# Patient Record
Sex: Female | Born: 1950 | Race: White | Hispanic: No | Marital: Married | Smoking: Never smoker
Health system: Southern US, Community
[De-identification: ages and names within clinical notes are randomized; demographics above are authoritative.]

## PROBLEM LIST (undated history)

## (undated) DIAGNOSIS — M1990 Unspecified osteoarthritis, unspecified site: Secondary | ICD-10-CM

## (undated) DIAGNOSIS — I10 Essential (primary) hypertension: Secondary | ICD-10-CM

## (undated) DIAGNOSIS — R51 Headache: Secondary | ICD-10-CM

## (undated) DIAGNOSIS — C801 Malignant (primary) neoplasm, unspecified: Secondary | ICD-10-CM

## (undated) HISTORY — PX: TONSILLECTOMY: SUR1361

## (undated) HISTORY — PX: TUBAL LIGATION: SHX77

---

## 1998-02-03 ENCOUNTER — Other Ambulatory Visit: Admission: RE | Admit: 1998-02-03 | Discharge: 1998-02-03 | Payer: Self-pay | Admitting: Obstetrics and Gynecology

## 1999-03-03 ENCOUNTER — Other Ambulatory Visit: Admission: RE | Admit: 1999-03-03 | Discharge: 1999-03-03 | Payer: Self-pay | Admitting: *Deleted

## 2001-09-17 ENCOUNTER — Other Ambulatory Visit: Admission: RE | Admit: 2001-09-17 | Discharge: 2001-09-17 | Payer: Self-pay | Admitting: Obstetrics and Gynecology

## 2002-10-30 ENCOUNTER — Other Ambulatory Visit: Admission: RE | Admit: 2002-10-30 | Discharge: 2002-10-30 | Payer: Self-pay | Admitting: Obstetrics and Gynecology

## 2003-09-04 ENCOUNTER — Ambulatory Visit (HOSPITAL_COMMUNITY): Admission: RE | Admit: 2003-09-04 | Discharge: 2003-09-04 | Payer: Self-pay | Admitting: Gastroenterology

## 2003-11-27 ENCOUNTER — Other Ambulatory Visit: Admission: RE | Admit: 2003-11-27 | Discharge: 2003-11-27 | Payer: Self-pay | Admitting: Obstetrics and Gynecology

## 2010-10-20 ENCOUNTER — Other Ambulatory Visit: Payer: Self-pay | Admitting: Obstetrics and Gynecology

## 2012-09-16 ENCOUNTER — Encounter (INDEPENDENT_AMBULATORY_CARE_PROVIDER_SITE_OTHER): Payer: Self-pay | Admitting: General Surgery

## 2012-09-16 ENCOUNTER — Ambulatory Visit (INDEPENDENT_AMBULATORY_CARE_PROVIDER_SITE_OTHER): Payer: Federal, State, Local not specified - PPO | Admitting: General Surgery

## 2012-09-16 VITALS — BP 152/90 | HR 76 | Temp 98.2°F | Resp 14 | Ht 66.7 in | Wt 206.0 lb

## 2012-09-16 DIAGNOSIS — C189 Malignant neoplasm of colon, unspecified: Secondary | ICD-10-CM

## 2012-09-16 NOTE — Progress Notes (Signed)
Office visit not done as preoperative H&P  Ashley Velasquez. [name], MD, FACS [phone] [facility]

## 2012-09-16 NOTE — H&P (Signed)
Ashley Velasquez is an 62 y.o. female.   Chief Complaint: Colon cancer on routine screening colonoscopy HPI: Patient has been diligent about colon cancer screening because maternal grandmother died at an early age of colon cancer prior to the patient's birth  Recent colonoscopy demonstrated a polyp, mostly benign, but with high grade dysplasia and small focus of adenocarcinoma (1.2cm).  The area of rresection colonoscopically was not marked with ink.  No past medical history on file.  Past Surgical History  Procedure Laterality Date  . Tubal ligation      Family History  Problem Relation Age of Onset  . Alzheimer's disease Mother    Social History:  reports that she has never smoked. She does not have any smokeless tobacco history on file. She reports that  drinks alcohol. Her drug history is not on file.  Allergies: No Known Allergies   (Not in a hospital admission)  No results found for this or any previous visit (from the past 48 hour(s)). No results found.  Review of Systems  All other systems reviewed and are negative.    Blood pressure 152/90, pulse 76, temperature 98.2 F (36.8 C), resp. rate 14, height 5' 6.7" (1.694 m), weight 206 lb (93.441 kg). Physical Exam  Constitutional: She is oriented to person, place, and time. She appears well-developed and well-nourished.  HENT:  Head: Normocephalic and atraumatic.  Eyes: Conjunctivae and EOM are normal. Pupils are equal, round, and reactive to light.  Neck: Normal range of motion. Neck supple.  Cardiovascular: Normal rate.   Respiratory: Effort normal and breath sounds normal.  GI: Soft. Bowel sounds are normal. She exhibits no mass. There is no tenderness. There is no rebound.  Musculoskeletal: Normal range of motion.  Neurological: She is alert and oriented to person, place, and time. She has normal reflexes.     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  CEA level will be drawn preoperatively. Will schedule patient for right hemicolectomy on Friday.  Preop bowel prep and antibiotics on call.  WYATT, JAMES O 09/16/2012, 2:36 PM    

## 2012-09-17 ENCOUNTER — Encounter (HOSPITAL_COMMUNITY): Payer: Self-pay

## 2012-09-17 ENCOUNTER — Ambulatory Visit
Admission: RE | Admit: 2012-09-17 | Discharge: 2012-09-17 | Disposition: A | Discharge status: Home / Self Care | Payer: Federal, State, Local not specified - PPO | Source: Ambulatory Visit | Attending: General Surgery | Admitting: General Surgery

## 2012-09-17 DIAGNOSIS — C189 Malignant neoplasm of colon, unspecified: Secondary | ICD-10-CM

## 2012-09-17 MED ORDER — IOHEXOL 300 MG/ML  SOLN
125.0000 mL | Freq: Once | INTRAMUSCULAR | Status: AC | PRN
  Administered 2012-09-17: 125 mL via INTRAVENOUS

## 2012-09-19 ENCOUNTER — Telehealth (INDEPENDENT_AMBULATORY_CARE_PROVIDER_SITE_OTHER): Payer: Self-pay | Admitting: *Deleted

## 2012-09-19 ENCOUNTER — Encounter (HOSPITAL_COMMUNITY)
Admission: RE | Admit: 2012-09-19 | Discharge: 2012-09-19 | Disposition: A | Discharge status: Home / Self Care | Payer: Federal, State, Local not specified - PPO | Source: Ambulatory Visit | Attending: Anesthesiology | Admitting: Anesthesiology

## 2012-09-19 ENCOUNTER — Encounter (HOSPITAL_COMMUNITY)
Admission: RE | Admit: 2012-09-19 | Discharge: 2012-09-19 | Disposition: A | Discharge status: Home / Self Care | Payer: Federal, State, Local not specified - PPO | Source: Ambulatory Visit | Attending: General Surgery | Admitting: General Surgery

## 2012-09-19 ENCOUNTER — Encounter (HOSPITAL_COMMUNITY): Payer: Self-pay

## 2012-09-19 HISTORY — DX: Essential (primary) hypertension: I10

## 2012-09-19 HISTORY — DX: Headache: R51

## 2012-09-19 HISTORY — DX: Unspecified osteoarthritis, unspecified site: M19.90

## 2012-09-19 HISTORY — DX: Malignant (primary) neoplasm, unspecified: C80.1

## 2012-09-19 LAB — COMPREHENSIVE METABOLIC PANEL
BUN: 14 mg/dL (ref 6–23)
CO2: 27 mEq/L (ref 19–32)
Chloride: 105 mEq/L (ref 96–112)
Creatinine, Ser: 0.66 mg/dL (ref 0.50–1.10)
GFR calc non Af Amer: >90 mL/min (ref >90)
Total Bilirubin: 0.5 mg/dL (ref 0.3–1.2)

## 2012-09-19 LAB — CBC WITH DIFFERENTIAL/PLATELET
Eosinophils Relative: 3 % (ref 0–5)
HCT: 39.6 % (ref 36.0–46.0)
Lymphocytes Relative: 29 % (ref 12–46)
Lymphs Abs: 1.3 10*3/uL (ref 0.7–4.0)
MCV: 84.8 fL (ref 78.0–100.0)
Monocytes Absolute: 0.5 10*3/uL (ref 0.1–1.0)
RBC: 4.67 MIL/uL (ref 3.87–5.11)
WBC: 4.5 10*3/uL (ref 4.0–10.5)

## 2012-09-19 MED ORDER — ALVIMOPAN 12 MG PO CAPS
12.0000 mg | ORAL_CAPSULE | Freq: Once | ORAL | Status: AC
  Administered 2012-09-20: 12 mg via ORAL
  Filled 2012-09-19: qty 1

## 2012-09-19 MED ORDER — METRONIDAZOLE IN NACL 5-0.79 MG/ML-% IV SOLN
500.0000 mg | INTRAVENOUS | Status: AC
  Administered 2012-09-20: 500 mg via INTRAVENOUS
  Filled 2012-09-19: qty 100

## 2012-09-19 MED ORDER — CEFAZOLIN SODIUM-DEXTROSE 2-3 GM-% IV SOLR
2.0000 g | INTRAVENOUS | Status: AC
  Administered 2012-09-20: 2 g via INTRAVENOUS
  Filled 2012-09-19: qty 50

## 2012-09-19 NOTE — Telephone Encounter (Signed)
[name] from [place] called to state that patient has not yet picked up her prescription for her GoLytely and antibiotic.  Patient plans on picking these up on the way home from the hospital.  Marcelino Duster made aware and will update Wyatt MD for instructions.

## 2012-09-19 NOTE — Telephone Encounter (Signed)
Called patient to advise her to go ahead with the bowel prep and antibiotics.  Patient will begin drinking GoLytely and take antibiotics at 10-21-08.  E

## 2012-09-19 NOTE — Pre-Procedure Instructions (Signed)
Ashley Velasquez  09/19/2012   Your procedure is scheduled on:  OFFICE WILL CALL    Report to [facility] at  AM.  Call this number if you have problems the morning of surgery: [phone]   Remember:   Do not eat food or drink liquids after midnight.   Take these medicines the morning of surgery with A SIP OF WATER:    Do not wear jewelry, make-up or nail polish.  Do not wear lotions, powders, or perfumes. You may wear deodorant.  Do not shave 48 hours prior to surgery.   Do not bring valuables to the hospital.  Contacts, dentures or bridgework may not be worn into surgery.   Leave suitcase in the car. After surgery it may be brought to your room.  For patients admitted to the hospital, checkout time is 11:00 AM the day of  discharge.   Patients discharged the day of surgery will not be allowed to drive  home.  Name and phone number of your driver: [name] --  SPOUSE   Special Instructions: Shower using CHG 2 nights before surgery and the night before surgery.  If you shower the day of surgery use CHG.  Use special wash - you have one bottle of CHG for all showers.  You should use approximately 1/3 of the bottle for each shower.   Please read over the following fact sheets that you were given: Pain Booklet, Coughing and Deep Breathing, MRSA Information and Surgical Site Infection Prevention

## 2012-09-20 ENCOUNTER — Inpatient Hospital Stay (HOSPITAL_COMMUNITY)
Admission: RE | Admit: 2012-09-20 | Discharge: 2012-09-24 | DRG: 149 | Disposition: A | Discharge status: Home / Self Care | Payer: Federal, State, Local not specified - PPO | Source: Ambulatory Visit | Attending: General Surgery | Admitting: General Surgery

## 2012-09-20 ENCOUNTER — Ambulatory Visit (HOSPITAL_COMMUNITY): Payer: Federal, State, Local not specified - PPO | Admitting: Certified Registered"

## 2012-09-20 ENCOUNTER — Encounter (HOSPITAL_COMMUNITY)
Admission: RE | Disposition: A | Discharge status: Home / Self Care | Payer: Self-pay | Source: Ambulatory Visit | Attending: General Surgery

## 2012-09-20 ENCOUNTER — Encounter (HOSPITAL_COMMUNITY): Payer: Self-pay | Admitting: Certified Registered"

## 2012-09-20 ENCOUNTER — Encounter (HOSPITAL_COMMUNITY): Payer: Self-pay | Admitting: *Deleted

## 2012-09-20 DIAGNOSIS — C189 Malignant neoplasm of colon, unspecified: Secondary | ICD-10-CM

## 2012-09-20 DIAGNOSIS — I10 Essential (primary) hypertension: Secondary | ICD-10-CM | POA: Diagnosis present

## 2012-09-20 DIAGNOSIS — D3A022 Benign carcinoid tumor of the ascending colon: Secondary | ICD-10-CM

## 2012-09-20 DIAGNOSIS — Z01812 Encounter for preprocedural laboratory examination: Secondary | ICD-10-CM

## 2012-09-20 DIAGNOSIS — Z01810 Encounter for preprocedural cardiovascular examination: Secondary | ICD-10-CM

## 2012-09-20 DIAGNOSIS — Z800 Family history of malignant neoplasm of digestive organs: Secondary | ICD-10-CM

## 2012-09-20 DIAGNOSIS — Z01818 Encounter for other preprocedural examination: Secondary | ICD-10-CM

## 2012-09-20 DIAGNOSIS — C180 Malignant neoplasm of cecum: Principal | ICD-10-CM | POA: Diagnosis present

## 2012-09-20 DIAGNOSIS — R5082 Postprocedural fever: Secondary | ICD-10-CM | POA: Diagnosis not present

## 2012-09-20 DIAGNOSIS — M129 Arthropathy, unspecified: Secondary | ICD-10-CM | POA: Diagnosis present

## 2012-09-20 HISTORY — PX: PARTIAL COLECTOMY: SHX5273

## 2012-09-20 SURGERY — COLECTOMY, PARTIAL
Anesthesia: General | Site: Abdomen | Laterality: Right | Wound class: Clean Contaminated

## 2012-09-20 MED ORDER — DIPHENHYDRAMINE HCL 50 MG/ML IJ SOLN: 12.5000 mg | Freq: Four times a day (QID) | INTRAMUSCULAR | Status: DC | PRN

## 2012-09-20 MED ORDER — BUPIVACAINE-EPINEPHRINE 0.25% -1:200000 IJ SOLN
INTRAMUSCULAR | Status: DC | PRN
  Administered 2012-09-20: 20 mL

## 2012-09-20 MED ORDER — ALVIMOPAN 12 MG PO CAPS
12.0000 mg | ORAL_CAPSULE | Freq: Two times a day (BID) | ORAL | Status: DC
  Administered 2012-09-21 – 2012-09-22 (×4): 12 mg via ORAL
  Filled 2012-09-20 (×6): qty 1

## 2012-09-20 MED ORDER — HYDROMORPHONE 0.3 MG/ML IV SOLN
INTRAVENOUS | Status: DC
  Administered 2012-09-20: 14:00:00 via INTRAVENOUS
  Administered 2012-09-20 (×2): 1.59 mg via INTRAVENOUS
  Administered 2012-09-21: 1.39 mg via INTRAVENOUS
  Administered 2012-09-21: 1.59 mg via INTRAVENOUS
  Administered 2012-09-21 (×2): via INTRAVENOUS
  Administered 2012-09-21: 2.11 mg via INTRAVENOUS
  Administered 2012-09-21: 3.39 mg via INTRAVENOUS
  Administered 2012-09-21: 2.39 mg via INTRAVENOUS
  Administered 2012-09-21: 0.59 mg via INTRAVENOUS
  Administered 2012-09-22: 0.44 mg via INTRAVENOUS
  Administered 2012-09-22: 0.2 mg via INTRAVENOUS
  Administered 2012-09-22: 0.6 mg via INTRAVENOUS
  Administered 2012-09-22: 1.39 mg via INTRAVENOUS
  Administered 2012-09-22: 0.599 mg via INTRAVENOUS
  Administered 2012-09-22: 0.59 mg via INTRAVENOUS
  Administered 2012-09-23: 0.799 mg via INTRAVENOUS
  Administered 2012-09-23: 0.4 mg via INTRAVENOUS
  Administered 2012-09-23: 0.799 mg via INTRAVENOUS
  Administered 2012-09-23: 09:00:00 via INTRAVENOUS
  Filled 2012-09-20 (×3): qty 25

## 2012-09-20 MED ORDER — HYDROMORPHONE 0.3 MG/ML IV SOLN
INTRAVENOUS | Status: AC
  Filled 2012-09-20: qty 25

## 2012-09-20 MED ORDER — ERYTHROMYCIN BASE 250 MG PO TABS: 1000.0000 mg | ORAL_TABLET | ORAL | Status: DC

## 2012-09-20 MED ORDER — ZOLPIDEM TARTRATE 5 MG PO TABS: 5.0000 mg | ORAL_TABLET | Freq: Every evening | ORAL | Status: DC | PRN

## 2012-09-20 MED ORDER — HYDROMORPHONE HCL PF 1 MG/ML IJ SOLN
INTRAMUSCULAR | Status: DC | PRN
  Administered 2012-09-20: 1 mg via INTRAVENOUS

## 2012-09-20 MED ORDER — LIDOCAINE HCL (CARDIAC) 20 MG/ML IV SOLN
INTRAVENOUS | Status: DC | PRN
  Administered 2012-09-20: 60 mg via INTRAVENOUS

## 2012-09-20 MED ORDER — KCL IN DEXTROSE-NACL 20-5-0.45 MEQ/L-%-% IV SOLN
INTRAVENOUS | Status: AC
  Filled 2012-09-20: qty 1000

## 2012-09-20 MED ORDER — ENOXAPARIN SODIUM 40 MG/0.4ML ~~LOC~~ SOLN
40.0000 mg | SUBCUTANEOUS | Status: DC
  Administered 2012-09-21 – 2012-09-23 (×3): 40 mg via SUBCUTANEOUS
  Filled 2012-09-20 (×5): qty 0.4

## 2012-09-20 MED ORDER — ROCURONIUM BROMIDE 100 MG/10ML IV SOLN
INTRAVENOUS | Status: DC | PRN
  Administered 2012-09-20: 50 mg via INTRAVENOUS
  Administered 2012-09-20: 10 mg via INTRAVENOUS

## 2012-09-20 MED ORDER — PEG 3350-KCL-NA BICARB-NACL 420 G PO SOLR: 4000.0000 mL | Freq: Once | ORAL | Status: DC

## 2012-09-20 MED ORDER — POVIDONE-IODINE 10 % EX OINT
TOPICAL_OINTMENT | CUTANEOUS | Status: AC
  Filled 2012-09-20: qty 28.35

## 2012-09-20 MED ORDER — MIDAZOLAM HCL 5 MG/5ML IJ SOLN
INTRAMUSCULAR | Status: DC | PRN
  Administered 2012-09-20: 2 mg via INTRAVENOUS

## 2012-09-20 MED ORDER — HYDROMORPHONE HCL PF 1 MG/ML IJ SOLN
0.2500 mg | INTRAMUSCULAR | Status: DC | PRN
  Administered 2012-09-20 (×4): 0.5 mg via INTRAVENOUS

## 2012-09-20 MED ORDER — LISINOPRIL 5 MG PO TABS
5.0000 mg | ORAL_TABLET | Freq: Every day | ORAL | Status: DC
  Administered 2012-09-20 – 2012-09-23 (×4): 5 mg via ORAL
  Filled 2012-09-20 (×5): qty 1

## 2012-09-20 MED ORDER — NALOXONE HCL 0.4 MG/ML IJ SOLN: 0.4000 mg | INTRAMUSCULAR | Status: DC | PRN

## 2012-09-20 MED ORDER — KCL IN DEXTROSE-NACL 20-5-0.45 MEQ/L-%-% IV SOLN
INTRAVENOUS | Status: DC
  Administered 2012-09-20 – 2012-09-23 (×8): via INTRAVENOUS
  Filled 2012-09-20 (×13): qty 1000

## 2012-09-20 MED ORDER — ALUM & MAG HYDROXIDE-SIMETH 200-200-20 MG/5ML PO SUSP: 30.0000 mL | Freq: Four times a day (QID) | ORAL | Status: DC | PRN

## 2012-09-20 MED ORDER — HYDROMORPHONE HCL PF 1 MG/ML IJ SOLN
INTRAMUSCULAR | Status: AC
  Filled 2012-09-20: qty 1

## 2012-09-20 MED ORDER — LACTATED RINGERS IV SOLN
INTRAVENOUS | Status: DC
  Administered 2012-09-20: 11:00:00 via INTRAVENOUS

## 2012-09-20 MED ORDER — DIPHENHYDRAMINE HCL 12.5 MG/5ML PO ELIX
12.5000 mg | ORAL_SOLUTION | Freq: Four times a day (QID) | ORAL | Status: DC | PRN
  Filled 2012-09-20: qty 5

## 2012-09-20 MED ORDER — METRONIDAZOLE IN NACL 5-0.79 MG/ML-% IV SOLN
500.0000 mg | Freq: Three times a day (TID) | INTRAVENOUS | Status: AC
Start: 2012-09-20 — End: 2012-09-20
  Administered 2012-09-20: 500 mg via INTRAVENOUS
  Filled 2012-09-20: qty 100

## 2012-09-20 MED ORDER — ONDANSETRON HCL 4 MG/2ML IJ SOLN: 4.0000 mg | Freq: Four times a day (QID) | INTRAMUSCULAR | Status: DC | PRN

## 2012-09-20 MED ORDER — LACTATED RINGERS IV SOLN
INTRAVENOUS | Status: DC | PRN
  Administered 2012-09-20 (×3): via INTRAVENOUS

## 2012-09-20 MED ORDER — ONDANSETRON HCL 4 MG/2ML IJ SOLN: 4.0000 mg | Freq: Once | INTRAMUSCULAR | Status: DC | PRN

## 2012-09-20 MED ORDER — SODIUM CHLORIDE 0.9 % IR SOLN
Status: DC | PRN
  Administered 2012-09-20 (×3): 1000 mL

## 2012-09-20 MED ORDER — FENTANYL CITRATE 0.05 MG/ML IJ SOLN
INTRAMUSCULAR | Status: DC | PRN
  Administered 2012-09-20: 50 ug via INTRAVENOUS
  Administered 2012-09-20 (×2): 100 ug via INTRAVENOUS
  Administered 2012-09-20 (×3): 50 ug via INTRAVENOUS

## 2012-09-20 MED ORDER — GLYCOPYRROLATE 0.2 MG/ML IJ SOLN
INTRAMUSCULAR | Status: DC | PRN
  Administered 2012-09-20: .6 mg via INTRAVENOUS

## 2012-09-20 MED ORDER — CEFAZOLIN SODIUM-DEXTROSE 2-3 GM-% IV SOLR
2.0000 g | Freq: Three times a day (TID) | INTRAVENOUS | Status: AC
  Administered 2012-09-20: 2 g via INTRAVENOUS
  Filled 2012-09-20: qty 50

## 2012-09-20 MED ORDER — NEOSTIGMINE METHYLSULFATE 1 MG/ML IJ SOLN
INTRAMUSCULAR | Status: DC | PRN
  Administered 2012-09-20: 4 mg via INTRAVENOUS

## 2012-09-20 MED ORDER — PROPOFOL 10 MG/ML IV BOLUS
INTRAVENOUS | Status: DC | PRN
  Administered 2012-09-20: 200 mg via INTRAVENOUS

## 2012-09-20 MED ORDER — ONDANSETRON HCL 4 MG/2ML IJ SOLN
INTRAMUSCULAR | Status: DC | PRN
  Administered 2012-09-20: 4 mg via INTRAVENOUS

## 2012-09-20 MED ORDER — BUPIVACAINE-EPINEPHRINE 0.25% -1:200000 IJ SOLN
INTRAMUSCULAR | Status: AC
  Filled 2012-09-20: qty 1

## 2012-09-20 MED ORDER — ATORVASTATIN CALCIUM 40 MG PO TABS
40.0000 mg | ORAL_TABLET | Freq: Every day | ORAL | Status: DC
  Administered 2012-09-20 – 2012-09-23 (×4): 40 mg via ORAL
  Filled 2012-09-20 (×5): qty 1

## 2012-09-20 MED ORDER — SODIUM CHLORIDE 0.9 % IJ SOLN: 9.0000 mL | INTRAMUSCULAR | Status: DC | PRN

## 2012-09-20 MED ORDER — NEOMYCIN SULFATE 500 MG PO TABS: 1000.0000 mg | ORAL_TABLET | ORAL | Status: DC

## 2012-09-20 SURGICAL SUPPLY — 66 items
ADH SKN CLS APL DERMABOND .7 (GAUZE/BANDAGES/DRESSINGS) ×1
BLADE SURG ROTATE 9660 (MISCELLANEOUS) IMPLANT
CANISTER SUCTION 2500CC (MISCELLANEOUS) ×2 IMPLANT
CHLORAPREP W/TINT 26ML (MISCELLANEOUS) ×2 IMPLANT
CLOTH BEACON ORANGE TIMEOUT ST (SAFETY) ×2 IMPLANT
CLSR STERI-STRIP ANTIMIC 1/2X4 (GAUZE/BANDAGES/DRESSINGS) ×1 IMPLANT
COVER MAYO STAND STRL (DRAPES) ×2 IMPLANT
COVER SURGICAL LIGHT HANDLE (MISCELLANEOUS) ×2 IMPLANT
DECANTER SPIKE VIAL GLASS SM (MISCELLANEOUS) ×1 IMPLANT
DERMABOND ADVANCED (GAUZE/BANDAGES/DRESSINGS) ×1
DERMABOND ADVANCED .7 DNX12 (GAUZE/BANDAGES/DRESSINGS) IMPLANT
DRAPE LAPAROSCOPIC ABDOMINAL (DRAPES) ×2 IMPLANT
DRAPE PROXIMA HALF (DRAPES) IMPLANT
DRAPE UTILITY 15X26 W/TAPE STR (DRAPE) ×6 IMPLANT
DRAPE WARM FLUID 44X44 (DRAPE) ×2 IMPLANT
DRSG TEGADERM 4X4.75 (GAUZE/BANDAGES/DRESSINGS) ×1 IMPLANT
ELECT BLADE 6.5 EXT (BLADE) ×1 IMPLANT
ELECT CAUTERY BLADE 6.4 (BLADE) ×4 IMPLANT
ELECT REM PT RETURN 9FT ADLT (ELECTROSURGICAL) ×2
ELECTRODE REM PT RTRN 9FT ADLT (ELECTROSURGICAL) ×1 IMPLANT
GLOVE BIO SURGEON STRL SZ 6.5 (GLOVE) ×3 IMPLANT
GLOVE BIOGEL PI IND STRL 7.0 (GLOVE) IMPLANT
GLOVE BIOGEL PI IND STRL 8 (GLOVE) ×2 IMPLANT
GLOVE BIOGEL PI INDICATOR 7.0 (GLOVE) ×3
GLOVE BIOGEL PI INDICATOR 8 (GLOVE) ×3
GLOVE ECLIPSE 7.5 STRL STRAW (GLOVE) ×5 IMPLANT
GLOVE EUDERMIC 6.5 POWDERFREE (GLOVE) ×1 IMPLANT
GLOVE SURG SS PI 7.0 STRL IVOR (GLOVE) ×1 IMPLANT
GOWN STRL NON-REIN LRG LVL3 (GOWN DISPOSABLE) ×13 IMPLANT
GOWN STRL REIN 2XL XLG LVL4 (GOWN DISPOSABLE) ×1 IMPLANT
GOWN STRL REIN 3XL XLG LVL4 (GOWN DISPOSABLE) ×1 IMPLANT
KIT BASIN OR (CUSTOM PROCEDURE TRAY) ×2 IMPLANT
KIT ROOM TURNOVER OR (KITS) ×2 IMPLANT
LEGGING LITHOTOMY PAIR STRL (DRAPES) IMPLANT
LIGASURE IMPACT 36 18CM CVD LR (INSTRUMENTS) ×1 IMPLANT
NDL HYPO 25GX1X1/2 BEV (NEEDLE) IMPLANT
NEEDLE HYPO 25GX1X1/2 BEV (NEEDLE) ×2 IMPLANT
NS IRRIG 1000ML POUR BTL (IV SOLUTION) ×5 IMPLANT
PACK GENERAL/GYN (CUSTOM PROCEDURE TRAY) ×2 IMPLANT
PAD ARMBOARD 7.5X6 YLW CONV (MISCELLANEOUS) ×2 IMPLANT
PAD SHARPS MAGNETIC DISPOSAL (MISCELLANEOUS) ×2 IMPLANT
RELOAD PROXIMATE 75MM BLUE (ENDOMECHANICALS) ×4 IMPLANT
RELOAD STAPLE 75 3.8 BLU REG (ENDOMECHANICALS) IMPLANT
SPECIMEN JAR X LARGE (MISCELLANEOUS) ×2 IMPLANT
SPONGE LAP 18X18 X RAY DECT (DISPOSABLE) ×2 IMPLANT
STAPLER GUN LINEAR PROX 60 (STAPLE) ×1 IMPLANT
STAPLER PROXIMATE 75MM BLUE (STAPLE) ×1 IMPLANT
STAPLER VISISTAT 35W (STAPLE) ×2 IMPLANT
SUCTION POOLE TIP (SUCTIONS) ×2 IMPLANT
SURGILUBE 2OZ TUBE FLIPTOP (MISCELLANEOUS) IMPLANT
SUT MNCRL AB 3-0 PS2 18 (SUTURE) ×1 IMPLANT
SUT PDS AB 1 TP1 96 (SUTURE) ×6 IMPLANT
SUT PROLENE 2 0 CT2 30 (SUTURE) IMPLANT
SUT PROLENE 2 0 KS (SUTURE) IMPLANT
SUT SILK 2 0 SH CR/8 (SUTURE) ×3 IMPLANT
SUT SILK 2 0 TIES 10X30 (SUTURE) ×2 IMPLANT
SUT SILK 3 0 SH CR/8 (SUTURE) ×2 IMPLANT
SUT SILK 3 0 TIES 10X30 (SUTURE) ×2 IMPLANT
SYR BULB IRRIGATION 50ML (SYRINGE) ×2 IMPLANT
SYR CONTROL 10ML LL (SYRINGE) ×1 IMPLANT
TOWEL OR 17X26 10 PK STRL BLUE (TOWEL DISPOSABLE) ×4 IMPLANT
TRAY FOLEY CATH 14FRSI W/METER (CATHETERS) ×1 IMPLANT
TRAY PROCTOSCOPIC FIBER OPTIC (SET/KITS/TRAYS/PACK) IMPLANT
UNDERPAD 30X30 INCONTINENT (UNDERPADS AND DIAPERS) IMPLANT
WATER STERILE IRR 1000ML POUR (IV SOLUTION) IMPLANT
YANKAUER SUCT BULB TIP NO VENT (SUCTIONS) ×2 IMPLANT

## 2012-09-20 NOTE — Progress Notes (Signed)
Patient transferred from PACU to [phone].  Alert and oriented, family at bedside.  Oriented to room and call light.  Will continue to monitor.

## 2012-09-20 NOTE — Anesthesia Preprocedure Evaluation (Signed)
Anesthesia Evaluation  Patient identified by MRN, date of birth, ID band Patient awake    Reviewed: Allergy & Precautions, H&P , NPO status , Patient's Chart, lab work & pertinent test results  Airway Mallampati: I TM Distance: >3 FB Neck ROM: full    Dental   Pulmonary          Cardiovascular hypertension, Rhythm:regular Rate:Normal     Neuro/Psych  Headaches,    GI/Hepatic   Endo/Other    Renal/GU      Musculoskeletal  (+) Arthritis -,   Abdominal   Peds  Hematology   Anesthesia Other Findings   Reproductive/Obstetrics                           Anesthesia Physical Anesthesia Plan  ASA: II  Anesthesia Plan: General   Post-op Pain Management:    Induction: Intravenous  Airway Management Planned: Oral ETT  Additional Equipment:   Intra-op Plan:   Post-operative Plan: Extubation in OR  Informed Consent: I have reviewed the patients History and Physical, chart, labs and discussed the procedure including the risks, benefits and alternatives for the proposed anesthesia with the patient or authorized representative who has indicated his/her understanding and acceptance.     Plan Discussed with: CRNA, Anesthesiologist and Surgeon  Anesthesia Plan Comments:         Anesthesia Quick Evaluation

## 2012-09-20 NOTE — Progress Notes (Signed)
UR completed 

## 2012-09-20 NOTE — H&P (View-Only) (Signed)
Ashley Velasquez is an 62 y.o. female.   Chief Complaint: Colon cancer on routine screening colonoscopy HPI: Patient has been diligent about colon cancer screening because maternal grandmother died at an early age of colon cancer prior to the patient's birth  Recent colonoscopy demonstrated a polyp, mostly benign, but with high grade dysplasia and small focus of adenocarcinoma (1.2cm).  The area of rresection colonoscopically was not marked with ink.  No past medical history on file.  Past Surgical History  Procedure Laterality Date  . Tubal ligation      Family History  Problem Relation Age of Onset  . Alzheimer's disease Mother    Social History:  reports that she has never smoked. She does not have any smokeless tobacco history on file. She reports that  drinks alcohol. Her drug history is not on file.  Allergies: No Known Allergies   (Not in a hospital admission)  No results found for this or any previous visit (from the past 48 hour(s)). No results found.  Review of Systems  All other systems reviewed and are negative.    Blood pressure 152/90, pulse 76, temperature 98.2 F (36.8 C), resp. rate 14, height 5' 6.7" (1.694 m), weight 206 lb (93.441 kg). Physical Exam  Constitutional: She is oriented to person, place, and time. She appears well-developed and well-nourished.  HENT:  Head: Normocephalic and atraumatic.  Eyes: Conjunctivae and EOM are normal. Pupils are equal, round, and reactive to light.  Neck: Normal range of motion. Neck supple.  Cardiovascular: Normal rate.   Respiratory: Effort normal and breath sounds normal.  GI: Soft. Bowel sounds are normal. She exhibits no mass. There is no tenderness. There is no rebound.  Musculoskeletal: Normal range of motion.  Neurological: She is alert and oriented to person, place, and time. She has normal reflexes.     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  CEA level will be drawn preoperatively. Will schedule patient for right hemicolectomy on Friday.  Preop bowel prep and antibiotics on call.  Cherylynn Ridges 09/16/2012, 2:36 PM

## 2012-09-20 NOTE — Anesthesia Procedure Notes (Signed)
Procedure Name: Intubation Date/Time: 09/20/2012 11:37 AM Performed by: Armandina Gemma Pre-anesthesia Checklist: Patient identified, Emergency Drugs available, Suction available, Patient being monitored and Timeout performed Patient Re-evaluated:Patient Re-evaluated prior to inductionOxygen Delivery Method: Circle system utilized Preoxygenation: Pre-oxygenation with 100% oxygen Intubation Type: IV induction Ventilation: Mask ventilation without difficulty Laryngoscope Size: Miller and 2 Grade View: Grade I Tube type: Oral Tube size: 7.0 mm Number of attempts: 1 Airway Equipment and Method: Stylet Placement Confirmation: ETT inserted through vocal cords under direct vision,  breath sounds checked- equal and bilateral and positive ETCO2 (atraumatic teeth and mouth as preop- bilat BS Smith) Secured at: 21 (21) cm Tube secured with: Tape Dental Injury: Teeth and Oropharynx as per pre-operative assessment

## 2012-09-20 NOTE — Op Note (Signed)
OPERATIVE REPORT  DATE OF OPERATION: 09/20/2012  PATIENT:  Ashley Velasquez  62 y.o. female  PRE-OPERATIVE DIAGNOSIS:  COLON CANCER in polyp  POST-OPERATIVE DIAGNOSIS:  Residual colonoscopic excision site in ascending colon  PROCEDURE:  Procedure(s): RIGHT HEMI COLECTOMY  SURGEON:  Surgeon(s): Cherylynn Ridges, MD Romie Levee, MD  ASSISTANT: Maisie Fus, A.  ANESTHESIA:   general  EBL: 100 ml  BLOOD ADMINISTERED: none  DRAINS: Urinary Catheter (Foley)   SPECIMEN:  Source of Specimen:  terminal ileum and right colon  COUNTS CORRECT:  YES  PROCEDURE DETAILS: The patient was taken to the operating room and placed on the table in the supine position. After an adequate general endotracheal anesthetic was administered she was prepped and draped in the usual sterile manner exposing her entire abdomen.  After proper time out was performed identifying the patient and the procedure to be performed a right transverse abdominal incision was made at the level of the umbilicus. It was taken down to and through the anterior rectus sheath. We then used cautery to go through the rectus muscle down to the posterior rectus sheath. We then incised the posterior rectus sheath while tenting up on it with forceps. We did so with electrocautery. We opened the incision medially and laterally. We then use Richardson retractor in place as we mobilized the colon.  The right colon was mobilized up to and past the mid transverse colon on the right side. We took down the hepatic flexure. We mobilized the terminal ileum and the cecum. All this we were able to bring up into the wound and resected. A GIA 675 stapler was passed across the terminal ileum and also the right transverse colon. The mesentery in between was taken using a LigaSure device. One large right colonic vessel was taken with a 2-0 silk tie.  Once the specimen was removed from the field the surgeon opened it on table away from the operative field. At  the site of the previous colonoscopic removal was a healing submucosal scar where the polyp had been removed. The distal margin was marked with a suture.  The surgeon then changed gown and glove. An anastomosis was made between the terminal ileum and the mid transverse colon using a GIA-75 stapler. The resulting enterotomy was subsequently closed using aTX-60 stapler. Once this was done the mesentery was closed using interrupted 2-0 silk sutures. Care was taken to make sure the small bowel and the large bowel mesentery was not twisted prior to the anastomosis.  Once the anastomosis was completed the surgeon and the assistant both changed gowns and gloves again. We irrigated with saline solution. Once this was done the fascia was closed posterior rectus sheath initially using running looped #1 PDS suture. The anterior rectus sheath was closed with the same suture. We irrigated the subcutaneous with saline solution we injected 20 cc of quarter percent Marcaine with epinephrine into the subcutaneous tissue then closed the skin using a running subcuticular stitch of 3-0 Monocryl. Dermabond and Steri-Strips and Tegaderms were used to complete the dressing.  All needle counts, sponge counts, and instrument counts were correct.  PATIENT DISPOSITION:  PACU - hemodynamically stable.   Cherylynn Ridges 3/7/20141:23 PM

## 2012-09-20 NOTE — Preoperative (Signed)
Beta Blockers   Reason not to administer Beta Blockers:Not Applicable 

## 2012-09-20 NOTE — Anesthesia Postprocedure Evaluation (Signed)
  Anesthesia Post-op Note  Patient: Ashley Velasquez  Procedure(s) Performed: Procedure(s): RIGHT HEMI COLECTOMY (Right)  Patient Location: PACU  Anesthesia Type:General  Level of Consciousness: alert   Airway and Oxygen Therapy: Patient Spontanous Breathing  Post-op Pain: mild  Post-op Assessment: Post-op Vital signs reviewed  Post-op Vital Signs: stable  Complications: No apparent anesthesia complications

## 2012-09-20 NOTE — Interval H&P Note (Signed)
History and Physical Interval Note:  Patient's CT scan does not suggest metastatic disease.  Will resect through right transverse incision.  Patient tolerated prep.  09/20/2012 11:06 AM  Ashley Velasquez  has presented today for surgery, with the diagnosis of COLON CANCER  The various methods of treatment have been discussed with the patient and family. After consideration of risks, benefits and other options for treatment, the patient has consented to  Procedure(s): RIGHT PARTIAL COLECTOMY (Right) as a surgical intervention .  The patient's history has been reviewed, patient examined, no change in status, stable for surgery.  I have reviewed the patient's chart and labs.  Questions were answered to the patient's satisfaction.     WYATT, Marta Lamas

## 2012-09-20 NOTE — Transfer of Care (Signed)
Immediate Anesthesia Transfer of Care Note  Patient: Ashley Velasquez  Procedure(s) Performed: Procedure(s): RIGHT HEMI COLECTOMY (Right)  Patient Location: PACU  Anesthesia Type:General  Level of Consciousness: sedated  Airway & Oxygen Therapy: Patient Spontanous Breathing and Patient connected to nasal cannula oxygen  Post-op Assessment: Report given to PACU RN and Post -op Vital signs reviewed and stable  Post vital signs: Reviewed and stable  Complications: No apparent anesthesia complications

## 2012-09-21 LAB — BASIC METABOLIC PANEL
BUN: 8 mg/dL (ref 6–23)
CO2: 24 mEq/L (ref 19–32)
Chloride: 103 mEq/L (ref 96–112)
Creatinine, Ser: 0.66 mg/dL (ref 0.50–1.10)
GFR calc non Af Amer: >90 mL/min (ref >90)
Sodium: 136 mEq/L (ref 135–145)

## 2012-09-21 LAB — CBC
HCT: 33.9 % — ABNORMAL LOW (ref 36.0–46.0)
Hemoglobin: 11.7 g/dL — ABNORMAL LOW (ref 12.0–15.0)
MCH: 29.5 pg (ref 26.0–34.0)
MCHC: 34.5 g/dL (ref 30.0–36.0)
RDW: 14.1 % (ref 11.5–15.5)

## 2012-09-21 MED ORDER — ACETAMINOPHEN 10 MG/ML IV SOLN
1000.0000 mg | Freq: Four times a day (QID) | INTRAVENOUS | Status: AC
  Administered 2012-09-21 – 2012-09-22 (×4): 1000 mg via INTRAVENOUS
  Filled 2012-09-21 (×5): qty 100

## 2012-09-21 NOTE — Progress Notes (Signed)
Patient interviewed and examined, agree with PA note above.  Mariella Saa MD, FACS  09/21/2012 12:59 PM

## 2012-09-21 NOTE — Progress Notes (Signed)
1 Day Post-Op  Subjective: Sore, no gas, doesn't want to sit up because it hurts, on liquids, I told her to go slow.  No flatus so far.    Objective: Vital signs in last 24 hours: Temp:  [97.1 F (36.2 C)-98.3 F (36.8 C)] 98.3 F (36.8 C) (03/08 0606) Pulse Rate:  [55-77] 70 (03/08 0606) Resp:  [11-21] 14 (03/08 0759) BP: (100-135)/(54-81) 106/54 mmHg (03/08 0606) SpO2:  [92 %-99 %] 97 % (03/08 0759) Weight:  [203 lb 14.8 oz (92.5 kg)] 203 lb 14.8 oz (92.5 kg) (03/07 1600) Last BM Date: 09/19/12 Diet: Full liquids, afebrile, VSS, labs OK,  Intake/Output from previous day: 03/07 0701 - 03/08 0700 In: 3840 [P.O.:120; I.V.:3570; IV Piggyback:150] Out: 1455 [Urine:1355; Blood:100] Intake/Output this shift:    General appearance: alert, cooperative and no distress Resp: clear to auscultation bilaterally GI: soft, very tender and sore, BS are present, slow, incision looks good, no flatus.  Lab Results:   Recent Labs  09/19/12 1153 09/21/12 0516  WBC 4.5 6.6  HGB 13.7 11.7*  HCT 39.6 33.9*  PLT 194 176    BMET  Recent Labs  09/19/12 1153 09/21/12 0516  NA 141 136  K 4.1 4.2  CL 105 103  CO2 27 24  GLUCOSE 93 112*  BUN 14 8  CREATININE 0.66 0.66  CALCIUM 10.0 8.6   PT/INR No results found for this basename: LABPROT, INR,  in the last 72 hours   Recent Labs Lab 09/19/12 1153  AST 34  ALT 49*  ALKPHOS 75  BILITOT 0.5  PROT 7.8  ALBUMIN 4.1     Lipase  No results found for this basename: lipase     Studies/Results: Dg Chest 2 View  09/19/2012  *RADIOLOGY REPORT*  Clinical Data: Hypertension, migraines.  Right partial colectomy.  CHEST - 2 VIEW  Comparison: CT abdomen pelvis 09/17/2012.  Findings: Trachea is midline.  Heart size normal.  A loculated area of lucency at the right lung base corresponds to a bullous lesion on the comparison examination of 09/17/2012.  Lungs are clear.  No pleural fluid.  IMPRESSION: No acute findings.   Original Report  Authenticated By: Leanna Battles, M.D.     Medications: . alvimopan  12 mg Oral BID  . atorvastatin  40 mg Oral QHS  . enoxaparin (LOVENOX) injection  40 mg Subcutaneous Q24H  . HYDROmorphone PCA 0.3 mg/mL   Intravenous Q4H  . lisinopril  5 mg Oral QHS    Assessment/Plan Colon cancer, s/p right partial colectomy 09/20/12 Dr. Lindie Spruce S/p tubal ligation   PLan:  Mobilize, and IS, continue PCA, add IV tylenol.    LOS: 1 day    Ashley Velasquez,Ashley Velasquez 09/21/2012

## 2012-09-22 NOTE — Progress Notes (Signed)
2 Days Post-Op  Subjective: No flatus but not as sore as before.  Got up some yesterday, and going to bathroom allot to void.  Objective: Vital signs in last 24 hours: Temp:  [98.3 F (36.8 C)-99.1 F (37.3 C)] 98.3 F (36.8 C) (03/09 0550) Pulse Rate:  [62-81] 63 (03/09 0550) Resp:  [14-20] 15 (03/09 0550) BP: (103-114)/(50-62) 103/54 mmHg (03/09 0550) SpO2:  [94 %-98 %] 97 % (03/09 0550) Last BM Date: 09/19/12 TM 99.1, VSS, no labs   Intake/Output from previous day: 03/08 0701 - 03/09 0700 In: 2945 [P.O.:370; I.V.:2375; IV Piggyback:200] Out: 3400 [Urine:3400] Intake/Output this shift:    General appearance: alert, cooperative and no distress Resp: clear to auscultation bilaterally GI: soft, sore, less tender rolling around in bed, few BS, no flatus, not distended.  Incision looks fine.  Lab Results:   Recent Labs  09/19/12 1153 09/21/12 0516  WBC 4.5 6.6  HGB 13.7 11.7*  HCT 39.6 33.9*  PLT 194 176    BMET  Recent Labs  09/19/12 1153 09/21/12 0516  NA 141 136  K 4.1 4.2  CL 105 103  CO2 27 24  GLUCOSE 93 112*  BUN 14 8  CREATININE 0.66 0.66  CALCIUM 10.0 8.6   PT/INR No results found for this basename: LABPROT, INR,  in the last 72 hours   Recent Labs Lab 09/19/12 1153  AST 34  ALT 49*  ALKPHOS 75  BILITOT 0.5  PROT 7.8  ALBUMIN 4.1     Lipase  No results found for this basename: lipase     Studies/Results: No results found.  Medications: . alvimopan  12 mg Oral BID  . atorvastatin  40 mg Oral QHS  . enoxaparin (LOVENOX) injection  40 mg Subcutaneous Q24H  . HYDROmorphone PCA 0.3 mg/mL   Intravenous Q4H  . lisinopril  5 mg Oral QHS    Assessment/Plan Colon cancer, s/p right partial colectomy 09/20/12 Dr. Lindie Spruce  S/p tubal ligation   Plan:  Continue sips and ice chips for today, continue to mobilize. IS.  LOS: 2 days    Ashley Velasquez 09/22/2012

## 2012-09-22 NOTE — Progress Notes (Signed)
I have seen and examined the patient and agree with the assessment and plans. No flatus yet  Douglas A. Magnus Ivan  MD, FACS

## 2012-09-23 ENCOUNTER — Encounter (HOSPITAL_COMMUNITY): Payer: Self-pay | Admitting: General Surgery

## 2012-09-23 LAB — URINALYSIS, ROUTINE W REFLEX MICROSCOPIC
Bilirubin Urine: NEGATIVE
Glucose, UA: NEGATIVE mg/dL
Hgb urine dipstick: NEGATIVE
Ketones, ur: NEGATIVE mg/dL
Leukocytes, UA: NEGATIVE
Nitrite: NEGATIVE
Protein, ur: NEGATIVE mg/dL
Specific Gravity, Urine: 1.004 — ABNORMAL LOW (ref 1.005–1.030)
Urobilinogen, UA: 0.2 mg/dL (ref 0.0–1.0)
pH: 7 (ref 5.0–8.0)

## 2012-09-23 LAB — BASIC METABOLIC PANEL WITH GFR
BUN: 6 mg/dL (ref 6–23)
CO2: 26 meq/L (ref 19–32)
Calcium: 9.4 mg/dL (ref 8.4–10.5)
Chloride: 102 meq/L (ref 96–112)
Creatinine, Ser: 0.64 mg/dL (ref 0.50–1.10)
GFR calc Af Amer: >90 mL/min
GFR calc non Af Amer: >90 mL/min
Glucose, Bld: 134 mg/dL — ABNORMAL HIGH (ref 70–99)
Potassium: 4.4 meq/L (ref 3.5–5.1)
Sodium: 138 meq/L (ref 135–145)

## 2012-09-23 LAB — CBC WITH DIFFERENTIAL/PLATELET
Basophils Absolute: 0 K/uL (ref 0.0–0.1)
Basophils Relative: 0 % (ref 0–1)
Eosinophils Absolute: 0.1 K/uL (ref 0.0–0.7)
Eosinophils Relative: 1 % (ref 0–5)
HCT: 37.8 % (ref 36.0–46.0)
Hemoglobin: 13.1 g/dL (ref 12.0–15.0)
Lymphocytes Relative: 12 % (ref 12–46)
Lymphs Abs: 1.2 K/uL (ref 0.7–4.0)
MCH: 30 pg (ref 26.0–34.0)
MCHC: 34.7 g/dL (ref 30.0–36.0)
MCV: 86.7 fL (ref 78.0–100.0)
Monocytes Absolute: 0.7 K/uL (ref 0.1–1.0)
Monocytes Relative: 7 % (ref 3–12)
Neutro Abs: 7.8 K/uL — ABNORMAL HIGH (ref 1.7–7.7)
Neutrophils Relative %: 80 % — ABNORMAL HIGH (ref 43–77)
Platelets: 195 K/uL (ref 150–400)
RBC: 4.36 MIL/uL (ref 3.87–5.11)
RDW: 13.8 % (ref 11.5–15.5)
WBC: 9.8 K/uL (ref 4.0–10.5)

## 2012-09-23 MED ORDER — HYDROMORPHONE HCL PF 1 MG/ML IJ SOLN: 0.5000 mg | INTRAMUSCULAR | Status: DC | PRN

## 2012-09-23 MED ORDER — SODIUM CHLORIDE 0.9 % IJ SOLN: 3.0000 mL | INTRAMUSCULAR | Status: DC | PRN

## 2012-09-23 MED ORDER — OXYCODONE-ACETAMINOPHEN 5-325 MG PO TABS
1.0000 | ORAL_TABLET | ORAL | Status: DC | PRN
  Administered 2012-09-23 – 2012-09-24 (×3): 2 via ORAL
  Filled 2012-09-23 (×3): qty 2

## 2012-09-23 NOTE — Progress Notes (Signed)
GS Progress Note Subjective: Patient is sitting up in chair feeling well.  Has had several bowel movements  Objective: Vital signs in last 24 hours: Temp:  [98.7 F (37.1 C)-100.7 F (38.2 C)] 100.7 F (38.2 C) (03/10 0845) Pulse Rate:  [79-89] 79 (03/10 0845) Resp:  [13-22] 14 (03/10 0805) BP: (108-124)/(63-79) 118/66 mmHg (03/10 0845) SpO2:  [94 %-97 %] 95 % (03/10 0805) Last BM Date: 09/22/12 (x 3 Bm's today)  Intake/Output from previous day: 03/09 0701 - 03/10 0700 In: 3015 [P.O.:590; I.V.:2425] Out: 3450 [Urine:3450] Intake/Output this shift:    Lungs: Clear  Abd: Soft, non-tender, good bowel sounds.  Extremities: No DVT signs or symptoms  Neuro: Intact  Lab Results: CBC   Recent Labs  09/21/12 0516  WBC 6.6  HGB 11.7*  HCT 33.9*  PLT 176   BMET  Recent Labs  09/21/12 0516  NA 136  K 4.2  CL 103  CO2 24  GLUCOSE 112*  BUN 8  CREATININE 0.66  CALCIUM 8.6   PT/INR No results found for this basename: LABPROT, INR,  in the last 72 hours ABG No results found for this basename: PHART, PCO2, PO2, HCO3,  in the last 72 hours  Studies/Results: No results found.  Anti-infectives: Anti-infectives   Start     Dose/Rate Route Frequency Ordered Stop   09/20/12 1800  ceFAZolin (ANCEF) IVPB 2 g/50 mL premix     2 g 100 mL/hr over 30 Minutes Intravenous 3 times per day 09/20/12 1607 09/20/12 1755   09/20/12 1800  metroNIDAZOLE (FLAGYL) IVPB 500 mg     500 mg 100 mL/hr over 60 Minutes Intravenous Every 8 hours 09/20/12 1607 09/20/12 1915   09/20/12 1607  erythromycin (E-MYCIN) tablet 1,000 mg  Status:  Discontinued     1,000 mg Oral 3 times per day 09/20/12 1608 09/20/12 1611   09/20/12 1607  neomycin (MYCIFRADIN) tablet 1,000 mg  Status:  Discontinued     1,000 mg Oral 3 times per day 09/20/12 1608 09/20/12 1611   09/20/12 0600  ceFAZolin (ANCEF) IVPB 2 g/50 mL premix     2 g 100 mL/hr over 30 Minutes Intravenous On call to O.R. 09/19/12 1414  09/20/12 1130   09/20/12 0600  metroNIDAZOLE (FLAGYL) IVPB 500 mg     500 mg 100 mL/hr over 60 Minutes Intravenous On call to O.R. 09/19/12 1414 09/20/12 1145      Assessment/Plan: s/p Procedure(s): RIGHT HEMI COLECTOMY Advance diet Saline lock IV UA with micro and urine culture Check CBC and Bmet. Pathology pending.  LOS: 3 days    Marta Lamas. [name], MD, FACS [phone] [facility] Surgery 09/23/2012

## 2012-09-24 LAB — URINE CULTURE: Colony Count: 3000

## 2012-09-24 MED ORDER — ZOLPIDEM TARTRATE 5 MG PO TABS: 5.0000 mg | ORAL_TABLET | Freq: Every evening | ORAL | Status: DC | PRN

## 2012-09-24 MED ORDER — OXYCODONE-ACETAMINOPHEN 5-325 MG PO TABS: 1.0000 | ORAL_TABLET | ORAL | Status: DC | PRN

## 2012-09-24 NOTE — Progress Notes (Signed)
DC home with husband, pt verbally understood dc instructions, noo questions asked

## 2012-09-24 NOTE — Discharge Summary (Signed)
Physician Discharge Summary  Patient ID: Ashley Velasquez MRN: [id number] DOB/AGE: [birth year] 62 y.o.  Admit date: 09/20/2012 Discharge date: 09/24/2012  Admission Diagnoses:  Discharge Diagnoses:  Active Problems:   * No active hospital problems. *   Discharged Condition: good  Hospital Course: Patient admitted the day of her operation.  She underwent right hemicolectomy for adenocarcinoma found in a sessile polyp removed during a routine screening colonoscopy.  She has done well postoperatively.  She did spike a fever to 100.7 the day prior to discharge, but resolved without specific treatment.  Her wound looked well without infection, and her fever workup was negative.  Consults: None  Significant Diagnostic Studies: labs: cbc/bmet and microbiology: urine culture: negative  Treatments: IV hydration, antibiotics: Ancef, metronidazole and perioperative only. and surgery: colectomy as described.  Discharge Exam: Blood pressure 101/60, pulse 67, temperature 98.1 F (36.7 C), temperature source Oral, resp. rate 19, height 5\' 6"  (1.676 m), weight 92.5 kg (203 lb 14.8 oz), SpO2 97.00%. General appearance: alert, cooperative, appears stated age and no distress Resp: clear to auscultation bilaterally GI: soft, non-tender; bowel sounds normal; no masses,  no organomegaly Incision/Wound: --clean and dry.  Covered with plasitic without drainage.  Disposition: Final discharge disposition not confirmed      Discharge Orders   Future Appointments Provider Department Dept Phone   10/08/2012 11:10 AM Cherylynn Ridges, MD [facility] Surgery, [place] [phone]   Future Orders Complete By Expires     Call MD for:  difficulty breathing, headache or visual disturbances  As directed     Call MD for:  extreme fatigue  As directed     Call MD for:  hives  As directed     Call MD for:  persistant dizziness or light-headedness  As directed     Call MD for:  persistant nausea and vomiting  As  directed     Call MD for:  redness, tenderness, or signs of infection (pain, swelling, redness, odor or green/yellow discharge around incision site)  As directed     Call MD for:  severe uncontrolled pain  As directed     Call MD for:  temperature >100.4  As directed     Diet general  As directed     Discharge instructions  As directed     Comments:      See printed instructions on post-colectomy care    Driving Restrictions  As directed     Comments:      None for one week.    Increase activity slowly  As directed     Leave dressing on - Keep it clean, dry, and intact until clinic visit  As directed     Lifting restrictions  As directed     Comments:      No greater than 20 pounds for 6 weeks.        Medication List    TAKE these medications       aspirin EC 81 MG tablet  Take 81 mg by mouth daily.     atorvastatin 40 MG tablet  Commonly known as:  LIPITOR  Take 40 mg by mouth at bedtime.     Cinnamon 500 MG Tabs  Take 500 mg by mouth daily.     lisinopril 5 MG tablet  Commonly known as:  PRINIVIL,ZESTRIL  Take 5 mg by mouth at bedtime.     Magnesium 500 MG Tabs  Take 500 mg by mouth at bedtime.  OVER THE COUNTER MEDICATION  Take 4 tablets by mouth daily. Heart Healthy Multivitamin for Women  From [employer] D[employer]     OVER THE COUNTER MEDICATION  Take 2 tablets by mouth 2 (two) times daily. Omega Complex Tablet containing other vitamin essentials  From [employer]     oxyCODONE-acetaminophen 5-325 MG per tablet  Commonly known as:  PERCOCET/ROXICET  Take 1-2 tablets by mouth every 4 (four) hours as needed.     vitamin C 500 MG tablet  Commonly known as:  ASCORBIC ACID  Take 500 mg by mouth daily.     VITAMIN D PO  Take 1 tablet by mouth daily. 1 tablet = 2400 units     VITAMIN K PO  Take 1 tablet by mouth daily. 1 tablet =     zolpidem 5 MG tablet  Commonly known as:  AMBIEN  Take 1 tablet (5 mg total) by mouth at bedtime as needed  for sleep.       Follow-up Information   Follow up with Cherylynn Ridges, MD On 10/08/2012. (Patient already has appointment to see me that day.)    Contact information:   [address] [place], [place] [address] [phone]       Signed: Cherylynn Ridges 09/24/2012, 6:22 AM

## 2012-10-08 ENCOUNTER — Ambulatory Visit (INDEPENDENT_AMBULATORY_CARE_PROVIDER_SITE_OTHER): Payer: Federal, State, Local not specified - PPO | Admitting: General Surgery

## 2012-10-08 VITALS — BP 132/82 | HR 78 | Temp 98.0°F | Resp 18 | Ht 66.75 in | Wt 198.0 lb

## 2012-10-08 DIAGNOSIS — D3A00 Benign carcinoid tumor of unspecified site: Secondary | ICD-10-CM

## 2012-10-08 DIAGNOSIS — Z09 Encounter for follow-up examination after completed treatment for conditions other than malignant neoplasm: Secondary | ICD-10-CM | POA: Insufficient documentation

## 2012-10-08 NOTE — Progress Notes (Signed)
The patient comes in today doing well after her right hemicolectomy. 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  All 15 of her left nose taken with resection were negative for tumor. The carcinoid tumor is distinctly different from the adenocarcinoma that was removed colonoscopically prior to her surgery.  Because of the new finding of the carcinoid tumor and the history of an adenocarcinoma a will make a referral to medical oncology for the patient to be followed up. She may need further scans in the future.on examination today her incision looks extremely good no erythema and there is no evidence of hernia. She is return to see me on an as-needed basis. However I have advised the patient to call me back within 2 weeks if she has not heard back from medical oncology.

## 2012-10-11 ENCOUNTER — Telehealth: Payer: Self-pay | Admitting: Oncology

## 2012-10-11 ENCOUNTER — Telehealth: Payer: Self-pay | Admitting: *Deleted

## 2012-10-11 NOTE — Telephone Encounter (Signed)
Spoke with patient by phone and confirmed appointment with Dr. Truett Perna for 11/01/12.  Contact names and phone numbers were provided.

## 2012-10-11 NOTE — Telephone Encounter (Signed)
PT SCHEDULED PER GINA. °WELCOME PACKET MAILED.  °

## 2012-10-14 ENCOUNTER — Telehealth: Payer: Self-pay | Admitting: Oncology

## 2012-10-14 NOTE — Telephone Encounter (Signed)
C/D 10/14/12 for appt. 11/01/12 °

## 2012-10-16 ENCOUNTER — Encounter (INDEPENDENT_AMBULATORY_CARE_PROVIDER_SITE_OTHER): Payer: Self-pay

## 2012-11-01 ENCOUNTER — Ambulatory Visit: Payer: Federal, State, Local not specified - PPO

## 2012-11-01 ENCOUNTER — Ambulatory Visit (HOSPITAL_BASED_OUTPATIENT_CLINIC_OR_DEPARTMENT_OTHER): Payer: Federal, State, Local not specified - PPO | Admitting: Oncology

## 2012-11-01 ENCOUNTER — Encounter: Payer: Self-pay | Admitting: Oncology

## 2012-11-01 ENCOUNTER — Other Ambulatory Visit: Payer: Federal, State, Local not specified - PPO | Admitting: Lab

## 2012-11-01 VITALS — BP 133/73 | HR 66 | Temp 97.9°F | Resp 20 | Ht 66.75 in | Wt 201.4 lb

## 2012-11-01 DIAGNOSIS — C189 Malignant neoplasm of colon, unspecified: Secondary | ICD-10-CM

## 2012-11-01 DIAGNOSIS — C172 Malignant neoplasm of ileum: Secondary | ICD-10-CM

## 2012-11-01 NOTE — Progress Notes (Signed)
[facility] New Patient Consult   Referring MD: Ravyn Nikkel 62 y.o.  [birth year]    Reason for Referral: Colon cancer and carcinoid tumor     HPI: She reports a history of colon polyps. She underwent a screening colonoscopy by Dr. Loreta Ave on 09/04/2012. A sessile polyp was found in the cecum. A polyp was 5 mm and was removed. A sessile polyp measuring 10 mm was removed from the mid a sending colon. Multiple diverticuli were noted in the sigmoid colon. Internal hemorrhoids were found during retroflexion. The pathology revealed invasive adenocarcinoma arising in an adenoma and additional fragments of a tubular adenoma with high-grade dysplasia. The largest fragment contained superficially invasive adenocarcinoma. The deep margin was negative, but adenomatous epithelium with high-grade dysplasia was noted at a lateral margin.  She was referred to Dr. Lindie Spruce. CTs of the abdomen and pelvis on 09/17/2012 revealed a large bulla at the right lung base. A 5 mm hypoenhancing lesion in the right hepatic dome was too small to characterize and statistically benign. No colonic wall thickening or mass. Small retroperitoneal lymph nodes measuring up to 8 mm. Uterine fibroids. No evidence of metastatic disease in the abdomen or pelvis. A chest x-ray on 09/19/2012 revealed no acute finding.  She was taken the operating room on 09/21/2012 and underwent a right hemicolectomy. Upon opening of the resected specimen a healing submucosal scar was noted with a polyp have been removed. An anastomosis was created between the terminal ileum and mid transverse colon.  The pathology [id number]) revealed a 1 cm carcinoid tumor in the terminal ileum with perineural and lymphovascular invasion. An area of the ulcerated colorectal mucosa was present in the a sending colon consistent with her prior biopsy site. No malignancy was identified at this site. No evidence of malignancy in 15 lymph  nodes. The carcinoid tumor was low grade with extension into the submucosa. The resection margins were negative for tumor.  She felt well prior to the colonoscopy and surgery.  Past Medical History  Diagnosis Date  . Hypertension   . Headache     ??? migraines  5-6 yrs apart, occurred on 2 occasions   . Cancer     recent dx  colon cancer, invasive low-grade adenocarcinoma in a a sending colon polyp with invasion into the submucosa   . Arthritis     "in her back"   . G2 P1, one stillborn  Past Surgical History  Procedure Laterality Date  . Tubal ligation    . Tonsillectomy    . Partial colectomy Right 09/20/2012    Procedure: RIGHT HEMI COLECTOMY;  Surgeon: Cherylynn Ridges, MD;  Location: [facility] OR;  Service: General;  Laterality: Right;    Family History  Problem Relation Age of Onset  . Alzheimer's disease Mother    .? Early breast cancer in her mother who is alive at age 36 with Alzheimer's disease. Maternal grandmother had polyps. No other family history of cancer  Current outpatient prescriptions:aspirin EC 81 MG tablet, Take 81 mg by mouth daily., Disp: , Rfl: ;  atorvastatin (LIPITOR) 40 MG tablet, Take 40 mg by mouth at bedtime. , Disp: , Rfl: ;  Cholecalciferol (VITAMIN D PO), Take 1 tablet by mouth daily. 1 tablet = 2400 units, Disp: , Rfl: ;  Cinnamon 500 MG TABS, Take 500 mg by mouth daily., Disp: , Rfl: ;  lisinopril (PRINIVIL,ZESTRIL) 5 MG tablet, Take 5 mg by mouth at bedtime. , Disp: ,  Rfl:  Magnesium 500 MG TABS, Take 500 mg by mouth at bedtime., Disp: , Rfl: ;  OVER THE COUNTER MEDICATION, Take 4 tablets by mouth daily. Heart Healthy Multivitamin for Women From [employer] Directions, Disp: , Rfl: ;  OVER THE COUNTER MEDICATION, Take 2 tablets by mouth 2 (two) times daily. Omega Complex Tablet containing other vitamin essentials From [employer], Disp: , Rfl:  vitamin C (ASCORBIC ACID) 500 MG tablet, Take 500 mg by mouth daily., Disp: , Rfl: ;  VITAMIN K PO, Take 1 tablet by  mouth daily. 1 tablet = , Disp: , Rfl:   Allergies: No Known Allergies  Social History: She lives in [place]. She is retired from the [employer]. She does not use cigarettes. She drinks alcohol on the weekends. No transfusion history. No risk factor for HIV or hepatitis.  History  Alcohol Use  . Yes    Comment: mainly on weekends--very infrequent    History  Smoking status  . Never Smoker   Smokeless tobacco  . Not on file     ROS:   Positives include: Left periorbital headache occurring 5 years apart on 2 occasions with associated diplopia  A complete ROS was otherwise negative.  Physical Exam:  Blood pressure 133/73, pulse 66, temperature 97.9 F (36.6 C), temperature source Oral, resp. rate 20, height 5' 6.75" (1.695 m), weight 201 lb 6.4 oz (91.354 kg), SpO2 99.00%.  HEENT: Oropharynx without visible mass, neck without mass Lungs: Clear bilaterally Cardiac: Regular rate and rhythm Abdomen: No hepatosplenomegaly, nontender, no mass, healed low transverse incision  Vascular: No leg edema Lymph nodes: No cervical, supraclavicular, axillary, or inguinal nodes. Pea-sized subcutaneous nodular area moves over the left clavicle Neurologic: Alert and oriented, the motor exam is grossly intact in the upper and lower extremities Skin: No rash Musculoskeletal: No spine tenderness   LAB:  CBC  Lab Results  Component Value Date   WBC 9.8 09/23/2012   HGB 13.1 09/23/2012   HCT 37.8 09/23/2012   MCV 86.7 09/23/2012   PLT 195 09/23/2012   ANC 7.8  CMP      Component Value Date/Time   NA 138 09/23/2012 1324   K 4.4 09/23/2012 1324   CL 102 09/23/2012 1324   CO2 26 09/23/2012 1324   GLUCOSE 134* 09/23/2012 1324   BUN 6 09/23/2012 1324   CREATININE 0.64 09/23/2012 1324   CALCIUM 9.4 09/23/2012 1324   PROT 7.8 09/19/2012 1153   ALBUMIN 4.1 09/19/2012 1153   AST 34 09/19/2012 1153   ALT 49* 09/19/2012 1153   ALKPHOS 75 09/19/2012 1153   BILITOT 0.5 09/19/2012 1153    GFRNONAA >90 09/23/2012 1324   GFRAA >90 09/23/2012 1324   CEA on 09/19/2012-2.1  Radiology: As per history of present illness    Assessment/Plan:   1. Stage I (T1 N0) well differentiated adenocarcinoma involving an a right colon polyp, status post a right hemicolectomy on 09/20/2012 with no residual tumor seen 2. 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 History of colon polyps-cecum and mid ascending colon polyps noted on a screening colonoscopy 09/04/2012  4. Indeterminate 5 mm right liver lesion on a CT of the abdomen 09/17/2012   Disposition:   She has been diagnosed with early stage colon cancer involving a right colon polyp. She has an excellent prognosis for a long-term disease-free survival. I do not recommend adjuvant therapy.  She  was found to have an incidental low-grade carcinoid tumor involving the terminal ileum. There is no indication for adjuvant therapy. I do not recommend additional x-rays or laboratory studies to evaluate the carcinoid tumor. She has no clinical symptoms to suggest carcinoid syndrome.  Her case will be presented at the GI tumor conference within the next few weeks. We will discuss the indication for additional evaluation of the indeterminate liver lesion.  She should continue colonoscopy followup with Dr. Loreta Ave.   Ms. Orvis Brill is not scheduled for a followup visit at cancer Center. We will be glad to see her in the future as needed.  Karandeep Resende 11/01/2012, 4:18 PM

## 2012-11-01 NOTE — Progress Notes (Signed)
Met with patient and husband.  Explained role of nurse navigator.  Educational information provided to patient on colorectal cancer and carcinoid tumors.  Patient understands she needs to have a colonoscopy in 1 year.  Contact names and phone numbers of [facility] team provided and encouraged patient to call for questions or problems.  She verbalized understanding.

## 2012-11-01 NOTE — Progress Notes (Signed)
Checked in new patient for dr visit.

## 2012-11-11 ENCOUNTER — Telehealth: Payer: Self-pay | Admitting: *Deleted

## 2012-11-11 NOTE — Telephone Encounter (Signed)
Per request of Dr. Truett Perna, patient was contacted and informed that MD would like to order a MRI in 9 months to f/u on liver lesion.  Dr. Truett Perna will schedule a return appointment to see him after the scan is done.  Explained to patient that, per Dr. Truett Perna, this is precautionary.  She verbalized understanding and was agreeable to the scan and f/u appointment.  Dr. Truett Perna informed and will order scan.

## 2012-11-14 ENCOUNTER — Other Ambulatory Visit: Payer: Self-pay | Admitting: Oncology

## 2012-11-14 DIAGNOSIS — C189 Malignant neoplasm of colon, unspecified: Secondary | ICD-10-CM

## 2012-11-15 ENCOUNTER — Other Ambulatory Visit: Payer: Self-pay | Admitting: *Deleted

## 2012-11-15 DIAGNOSIS — C189 Malignant neoplasm of colon, unspecified: Secondary | ICD-10-CM

## 2012-11-18 ENCOUNTER — Telehealth: Payer: Self-pay | Admitting: Oncology

## 2012-11-18 NOTE — Telephone Encounter (Signed)
sw pt and advised on 5.1.14 and 5.3.14 appt...pt ok and awre

## 2013-05-16 ENCOUNTER — Telehealth: Payer: Self-pay | Admitting: *Deleted

## 2013-05-16 NOTE — Telephone Encounter (Signed)
Returned call and left VM to call back to discuss or read My Chart message being sent.

## 2013-05-16 NOTE — Telephone Encounter (Signed)
Left voice mail that she is moving to [facility] area end of November and asking for name of oncologist to establish with there. May need to cancel her MRI and office visit with Dr. Truett Perna for December. Per Dr. Truett Perna, he is personally not aware of any physician in area. [facility] in [place] is closest=[facility] [phone]  Office fax #[phone]

## 2013-05-19 ENCOUNTER — Encounter: Payer: Self-pay | Admitting: Oncology

## 2013-05-22 ENCOUNTER — Other Ambulatory Visit: Payer: Self-pay

## 2013-06-16 ENCOUNTER — Other Ambulatory Visit: Payer: Federal, State, Local not specified - PPO | Admitting: Lab

## 2013-06-16 ENCOUNTER — Ambulatory Visit (HOSPITAL_COMMUNITY): Payer: Federal, State, Local not specified - PPO

## 2013-06-19 ENCOUNTER — Ambulatory Visit: Payer: Federal, State, Local not specified - PPO | Admitting: Oncology

## 2013-08-09 IMAGING — CR DG CHEST 2V
2 series · 2 of 2 positions shown · non-contrast
Comparison: CT abdomen pelvis 09/17/2012.

CLINICAL DATA: Hypertension, migraines.  Right partial colectomy.

CHEST - 2 VIEW

[view not recorded (1 of 2)]
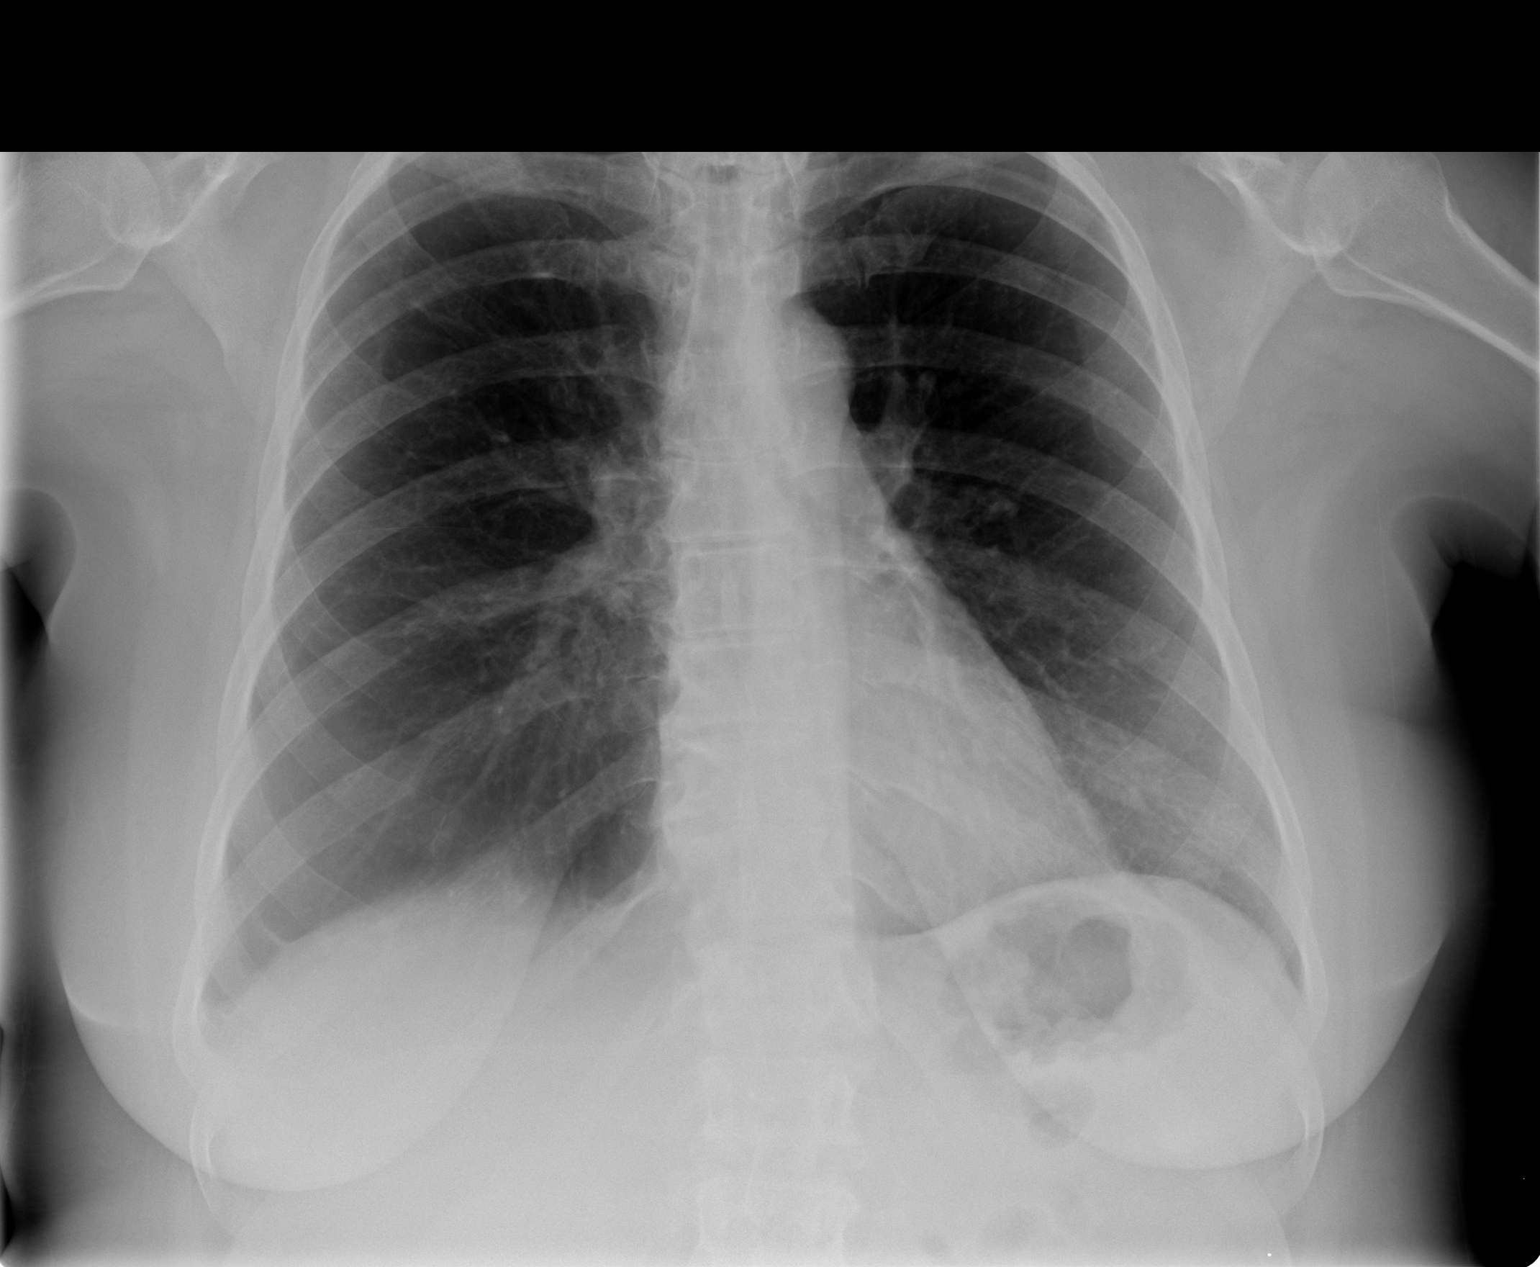

[view not recorded (2 of 2)]
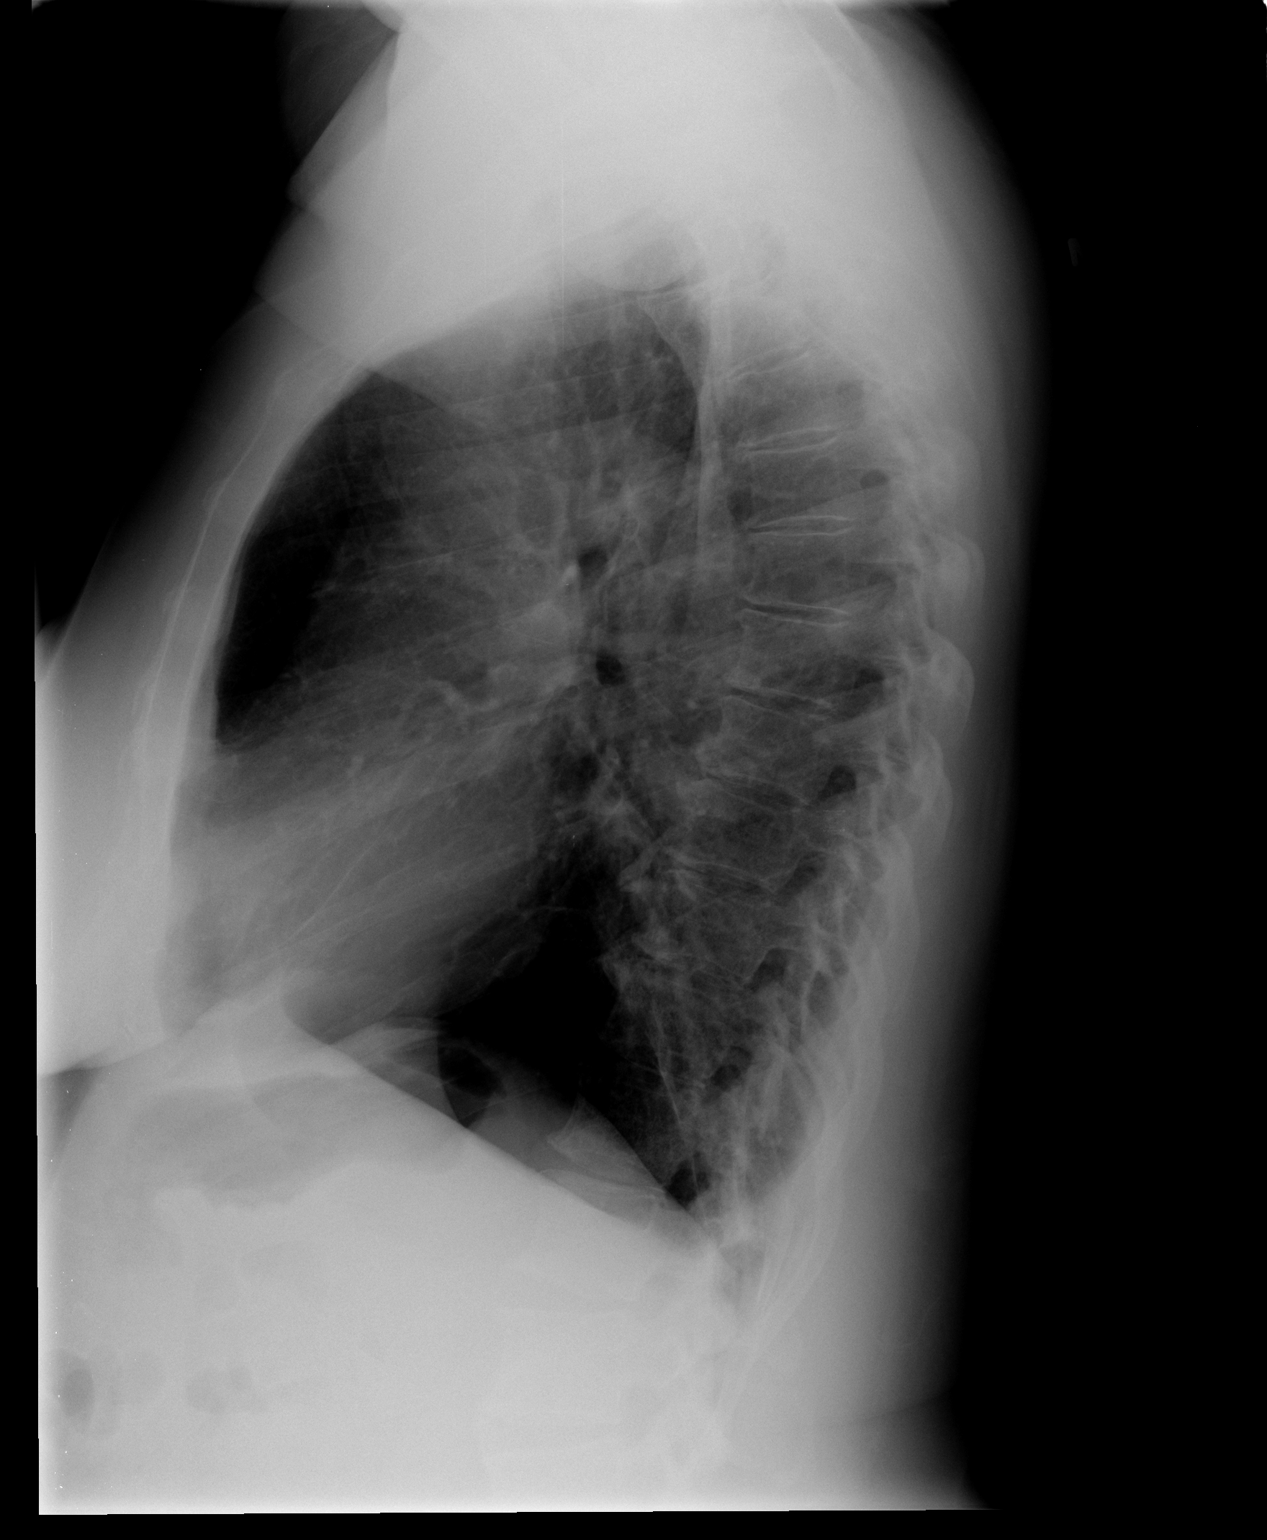

[2 of 2 positions shown; findings below may reference images not displayed]

FINDINGS: Trachea is midline.  Heart size normal.  A loculated area
of lucency at the right lung base corresponds to a bullous lesion
on the comparison examination of 09/17/2012.  Lungs are clear.  No
pleural fluid.
IMPRESSION: No acute findings.

## 2013-08-11 ENCOUNTER — Telehealth: Payer: Self-pay | Admitting: Oncology

## 2013-08-11 NOTE — Telephone Encounter (Signed)
Faxed pt medical records to [facility]

## 2014-05-01 ENCOUNTER — Other Ambulatory Visit: Payer: Self-pay
